# Patient Record
Sex: Male | Born: 1975 | Race: Black or African American | Hispanic: No | Marital: Married | Smoking: Never smoker
Health system: Southern US, Community
[De-identification: ages and names within clinical notes are randomized; demographics above are authoritative.]

---

## 2019-05-17 ENCOUNTER — Encounter (HOSPITAL_COMMUNITY): Payer: Self-pay

## 2019-05-17 ENCOUNTER — Emergency Department (HOSPITAL_COMMUNITY)
Admission: EM | Admit: 2019-05-17 | Discharge: 2019-05-17 | Disposition: A | Discharge status: Home / Self Care | Payer: Non-veteran care | Attending: Emergency Medicine | Admitting: Emergency Medicine

## 2019-05-17 ENCOUNTER — Emergency Department (HOSPITAL_COMMUNITY): Payer: Non-veteran care

## 2019-05-17 ENCOUNTER — Other Ambulatory Visit: Payer: Self-pay

## 2019-05-17 DIAGNOSIS — M79604 Pain in right leg: Secondary | ICD-10-CM | POA: Diagnosis present

## 2019-05-17 MED ORDER — CYCLOBENZAPRINE HCL 10 MG PO TABS: 10.0000 mg | ORAL_TABLET | Freq: Two times a day (BID) | ORAL | 0 refills | Status: AC | PRN

## 2019-05-17 MED ORDER — IBUPROFEN 600 MG PO TABS: 600.0000 mg | ORAL_TABLET | Freq: Four times a day (QID) | ORAL | 0 refills | Status: AC | PRN
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
## 2019-05-17 NOTE — Discharge Instructions (Signed)
X-ray of the ankle without any signs of injury to the bone.  Wear brace for support.  Take medications prescribed.  Return if you have any concern.

## 2021-10-26 IMAGING — CR DG ANKLE COMPLETE 3+V*R*
3 series · 3 of 3 positions shown · non-contrast
Comparison: None.

CLINICAL DATA: Ankle pain.

EXAM:
RIGHT ANKLE - COMPLETE 3+ VIEW

[x ankle ap right]
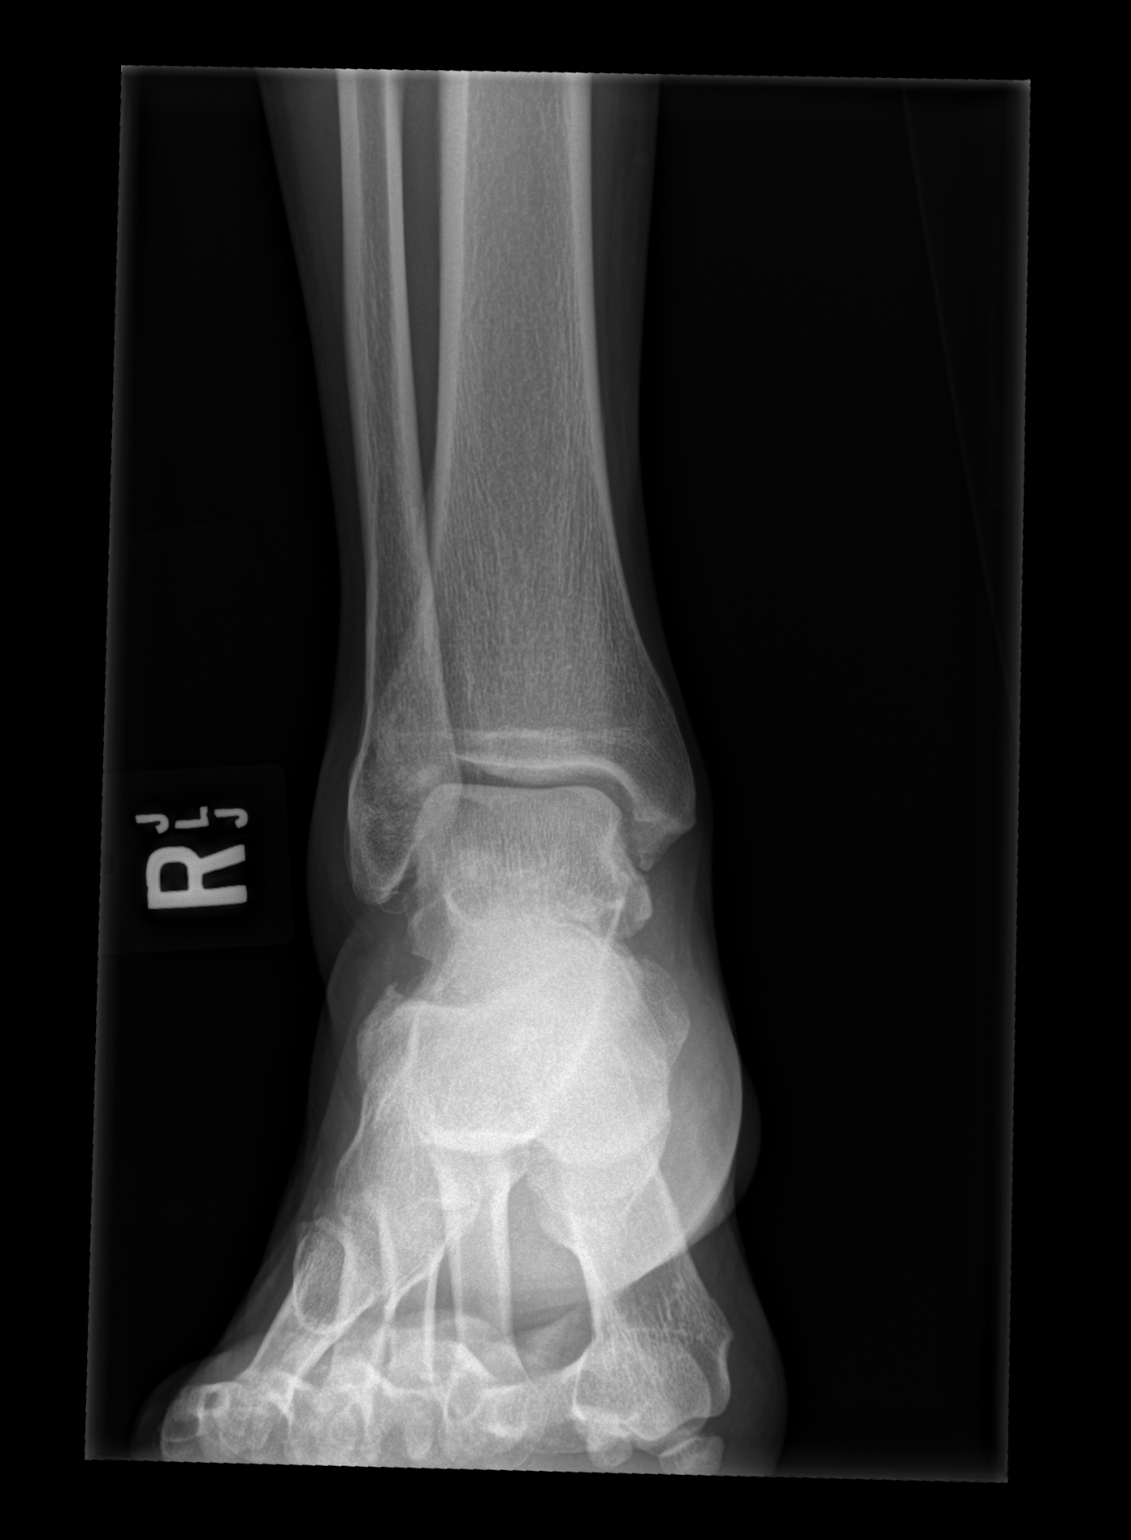

[x ankle obl right]
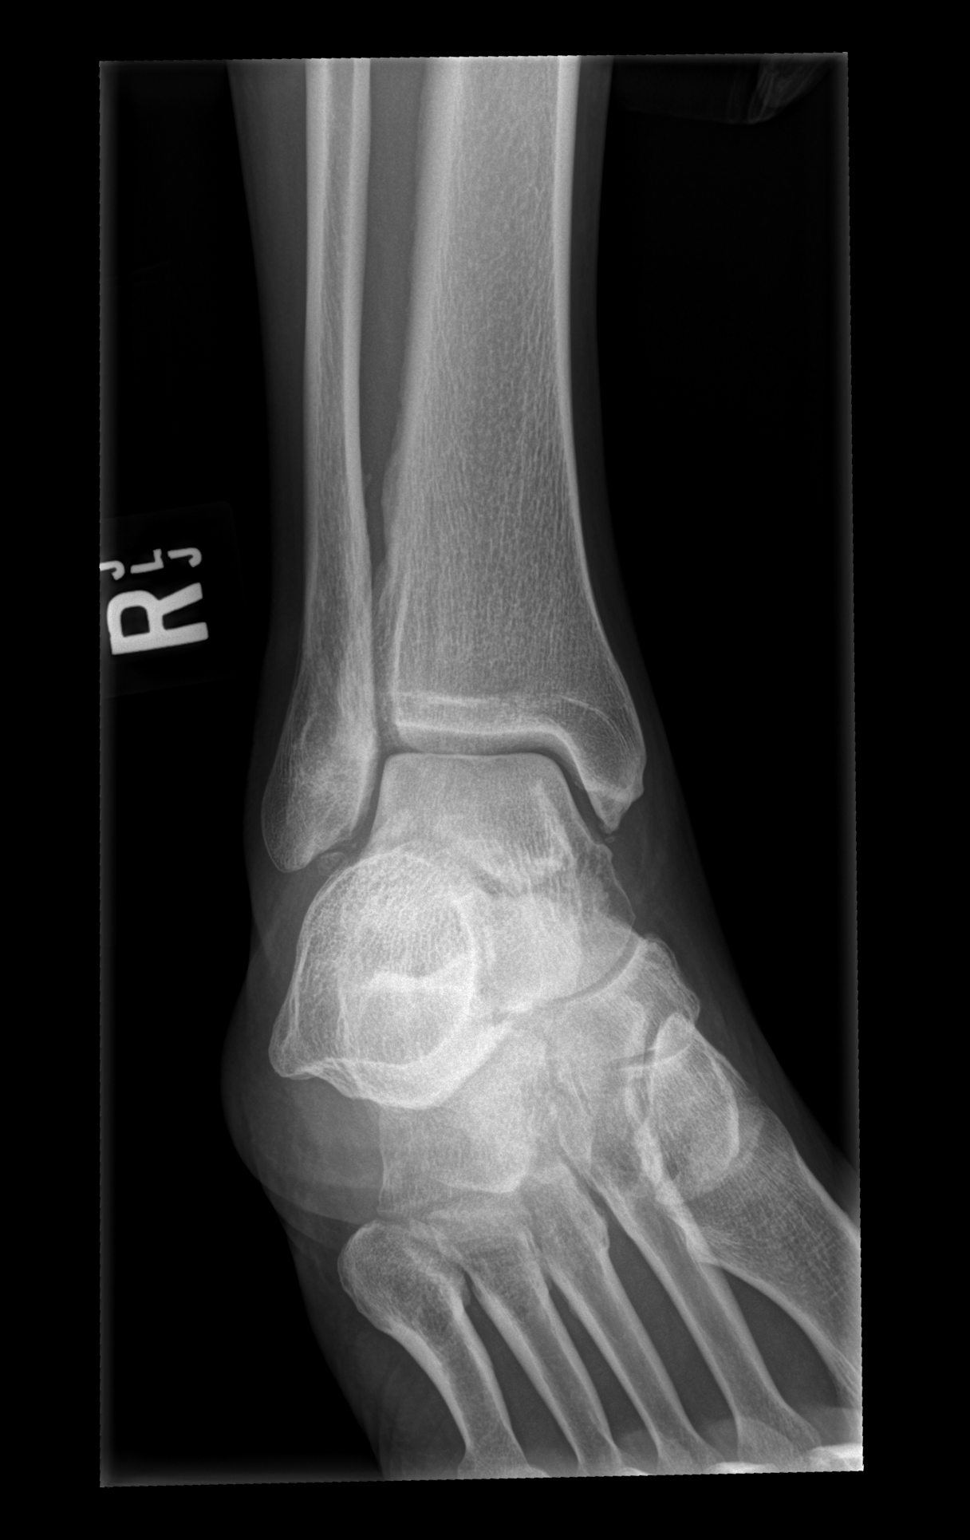

[x ankle lat right]
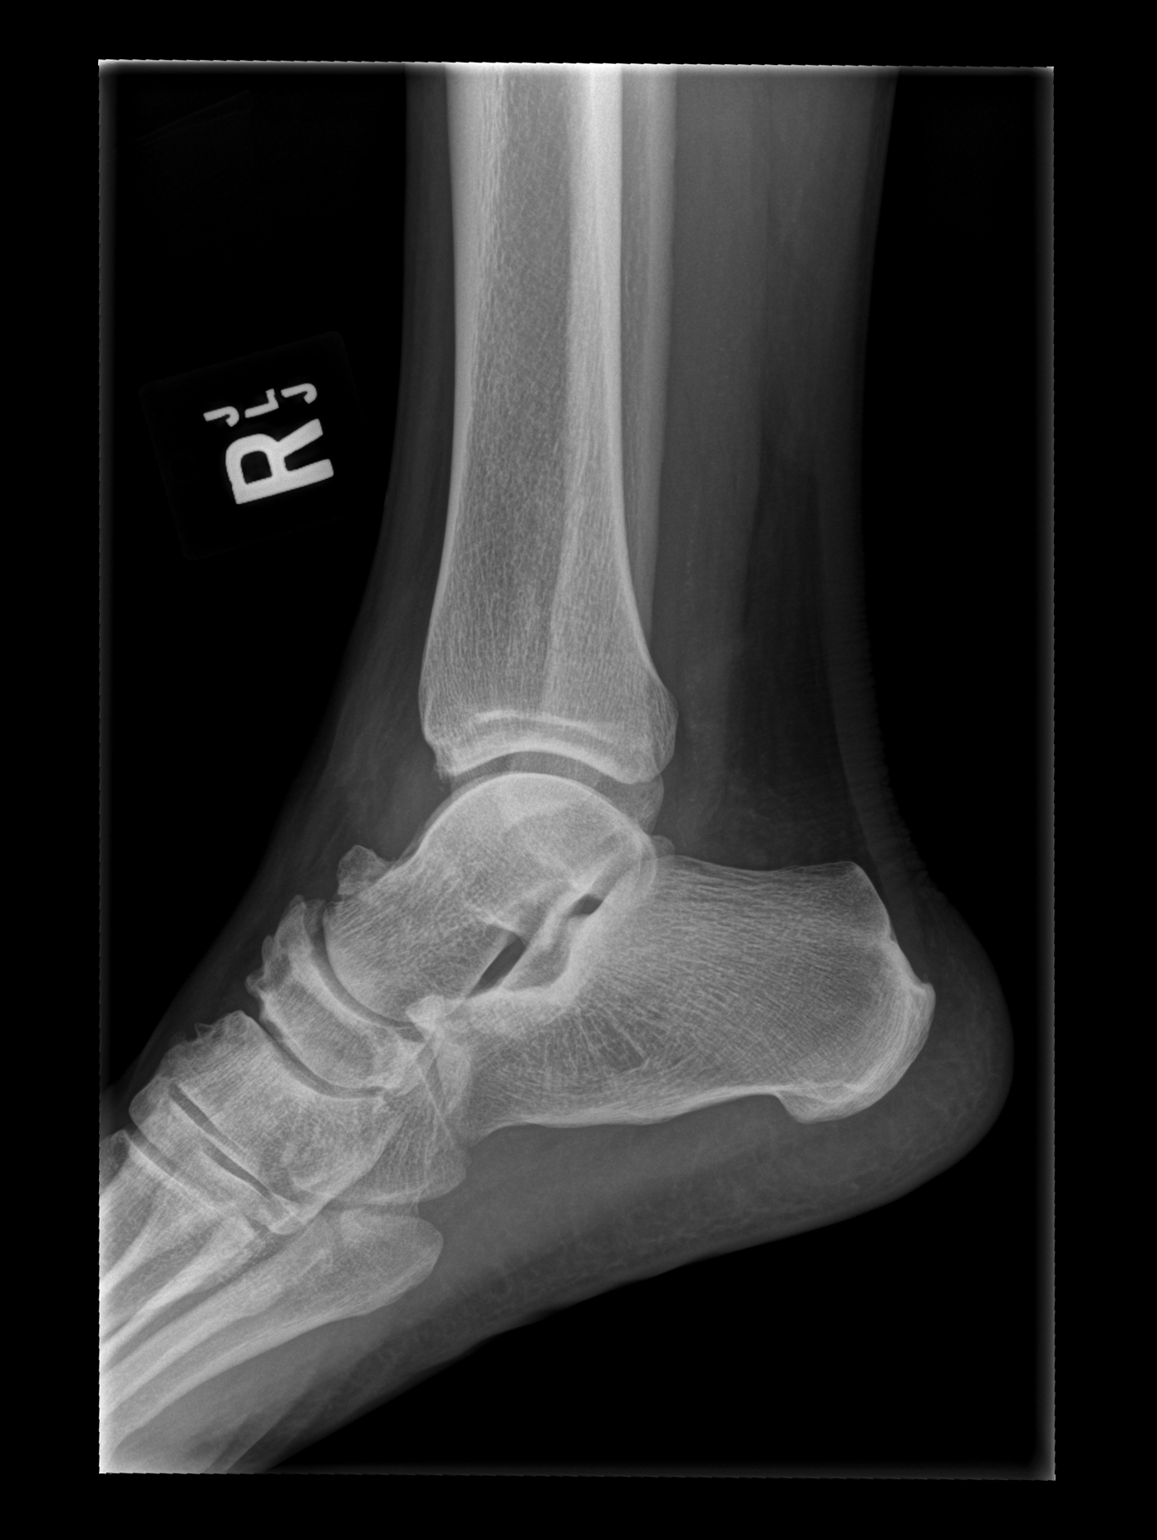

[3 of 3 positions shown; findings below may reference images not displayed]

FINDINGS: There is no acute displaced fracture or dislocation. There are small
osseous fragments at the distal fibula and medial malleolus likely
related to old remote injuries. There is an irregular appearance of
the dorsal navicular and talus which are likely related to old
remote injuries. There is mild soft tissue swelling about the ankle.
IMPRESSION: Mild soft tissue swelling about the ankle without evidence for an
acute displaced fracture or dislocation.
